# Patient Record
Sex: Female | Born: 1955 | Hispanic: Yes | Marital: Single | Smoking: Never smoker
Health system: Southern US, Community
[De-identification: ages and names within clinical notes are randomized; demographics above are authoritative.]

## PROBLEM LIST (undated history)

## (undated) DIAGNOSIS — E119 Type 2 diabetes mellitus without complications: Secondary | ICD-10-CM

## (undated) DIAGNOSIS — I10 Essential (primary) hypertension: Secondary | ICD-10-CM

---

## 2008-04-16 ENCOUNTER — Emergency Department: Payer: Self-pay | Admitting: Emergency Medicine

## 2012-11-23 ENCOUNTER — Ambulatory Visit: Payer: Self-pay | Admitting: Family Medicine

## 2013-09-29 NOTE — ED Provider Notes (Signed)
 [facility] Emergency Department Provider Note  ED Clinical Impression   Final diagnoses:  Anemia (Primary)  Sciatica, left    Initial Impression, ED Course, Assessment and Plan   Breanna Booker is a 58 y.o. female with 2-66m h/o rectal bleeding, presents with two complaints: 1. Continuation of rectal bleeding since hospitalization 2. Leg cramping/pain.  Regarding rectal bleeding, no obvious source of bleeding found during hospitalization, pt has upcoming capsule study scheduled outpt with GI. No new or unusual features today, has been ongoing 3-4 months without change. Will check CBC today to ensure her Hgb is stable from discharge.   Regarding leg pain, H+P most c/w sciatica. DVT in the differential in pt s/p hospitalization but there is no evidence of this on her physical exam. Will also check electrolyte panel for any abnormalities that may be contributing to cramping sensation (BMP, Mg, ionized calcium). In the meantime will give Motrin and Valium for pain control.   Sep 29, 2013 2:01 PM H/H stable from discharge, no electrolyte abnormalities. Pt states she feels much better, ambulating normally, leg cramping has resolved. Will DC home at this time for PCP follow-up.   ____________________________________________  Time seen: Sep 29, 2013 11:51 AM  I have reviewed the triage vital signs and the nursing notes.  I have discussed the case with the ED Attending, Dr. Patrcia.   History   Chief Complaint Rectal Bleeding   HPI  Breanna Booker is a 58 y.o. female with 2-69m h/o rectal bleeding, presents with two main complaints today.   1. Rectal bleeding:  Recently hospitalized for microcytic anemia, Hgb 7.7 transfused 2U. Endoscopy/colonoscopy neg for source of bleeding, pt scheduled for capsule study with GI as outpatient. Patient states that her rectal bleeding never resolved since last admission. She endorses brown stools and feeling dizzy. No abdominal pain, nausea, or  vomiting. No CP or SOB.   2. Leg pain/cramping: Patient also reports LLE muscle cramping and shooting pain from the left hip all the way to the left foot upon standing up, but not when lying down. No history of same. No swelling or redness. She denies taking any pain medication for this pain.  Patient and husband are concerned that this represents a reaction to one of her medications (has been taking Miralax and iron tablets as prescribed since discharge).    No past medical history on file.  Past Surgical History  Procedure Laterality Date  . Pr colonoscopy flx dx w/collj spec when pfrmd Left 09/24/2013    Procedure: COLONOSCOPY, FLEXIBLE, PROXIMAL TO SPLENIC FLEXURE; DIAGNOSTIC, W/WO COLLECTION SPECIMEN BY BRUSH OR WASH;  Surgeon: Dorn JINNY Lauth, MD;  [facility]: [facility];  Service: Gastroenterology  . Pr upper gi endoscopy,biopsy N/A 09/24/2013    Procedure: UGI ENDOSCOPY; WITH BIOPSY, SINGLE OR MULTIPLE;  Surgeon: Dorn JINNY Lauth, MD;  Location: [facility];  Service: Gastroenterology    Current Outpatient Rx  Name  Route  Sig  Dispense  Refill  . ferrous sulfate 325 (65 FE) MG tablet      Take 1 tablet daily for 1 week. If tolerated, increase to 1 tablet twice daily.   60 tablet   2   . hydrocortisone (ANUSOL-HC) 2.5 % rectal cream   Rectal   Insert into the rectum Two (2) times a day.         . polyethylene glycol (MIRALAX) 17 gram packet      Take 1 packet twice daily to prevent  constipation. Increase or decrease dose as needed to have 1 soft BM daily.   60 packet   2     Allergies Review of patient's allergies indicates no known allergies.  No family history on file.  Social History History  Substance Use Topics  . Smoking status: Never Smoker   . Smokeless tobacco: Not on file  . Alcohol Use: No    Review of Systems Constitutional: Negative for fever. Eyes: Negative for visual changes. ENT: Negative for sore  throat. Cardiovascular: Negative for chest pain. Respiratory: Negative for shortness of breath. Gastrointestinal: Negative for abdominal pain, vomiting or diarrhea. + rectal bleeding Genitourinary: Negative for dysuria. Musculoskeletal: Negative for back pain. + LLE muscle cramping Skin: Negative for rash. Neurological: Negative for headaches. + shooting pain in LLE.    Physical Exam   VITAL SIGNS:   ED Triage Vitals  Enc Vitals Group     BP 09/29/13 1133 128/68 mmHg     Heart Rate 09/29/13 1133 81     Resp 09/29/13 1133 18     Temp 09/29/13 1133 37 C (98.6 F)     Temp Source 09/29/13 1133 Skin     SpO2 09/29/13 1133 97 %    Constitutional: Alert and oriented. Well appearing and in no distress. Eyes: Conjunctivae are normal. ENT      Head: Normocephalic and atraumatic.      Nose: No congestion.      Mouth/Throat: Mucous membranes are moist.      Neck: No stridor. Hematological/Lymphatic/Immunilogical: No cervical lymphadenopathy.  Cardiovascular: Normal rate, regular rhythm. Normal and symmetric distal pulses are present in all extremities. Respiratory: Normal respiratory effort. Breath sounds are normal. Gastrointestinal: Guaiac positive. Soft and nontender. Normal bowel sounds. No rebound or guarding. There is no CVA tenderness. Rectal: Light brown stool, guiac positive Musculoskeletal: Mildly TTP over paraspinal muscles of L-spine on L and L gluteus. No midline spinal tenderness.  Neurologic: Normal speech and language. No gross focal neurologic deficits are appreciated. Skin: Skin is warm, dry and intact. No rash noted. Psychiatric: Mood and affect are normal. Speech and behavior are normal.   EKG   Not applicable  Radiology   Not applicable  Procedures   None    Pertinent labs & imaging results that were available during my care of the patient were reviewed by me and considered in my medical decision making (see chart for  details).  __________________________________________________________________ Documentation assistance was provided by Thad Reding, Scribe, on Sep 29, 2013 at 11:51 AM for Dr. Judeth.   A scribe was used when documenting this visit. I agree with the above documentation. Signed by  Lauraine Judeth on  Sep 29, 2013 at 12:03 PM       Lauraine Judeth, MD Resident 09/29/13 [phone]

## 2013-10-17 ENCOUNTER — Ambulatory Visit: Payer: Self-pay | Admitting: Surgery

## 2013-10-17 LAB — CBC WITH DIFFERENTIAL/PLATELET
Basophil #: 0 10*3/uL (ref 0.0–0.1)
Basophil %: 0.4 %
Eosinophil #: 0.1 10*3/uL (ref 0.0–0.7)
Eosinophil %: 1.9 %
HCT: 36.3 % (ref 35.0–47.0)
HGB: 11.8 g/dL — AB (ref 12.0–16.0)
LYMPHS PCT: 39.1 %
Lymphocyte #: 2.2 10*3/uL (ref 1.0–3.6)
MCH: 26.2 pg (ref 26.0–34.0)
MCHC: 32.6 g/dL (ref 32.0–36.0)
MCV: 81 fL (ref 80–100)
MONO ABS: 0.5 x10 3/mm (ref 0.2–0.9)
MONOS PCT: 9 %
Neutrophil #: 2.8 10*3/uL (ref 1.4–6.5)
Neutrophil %: 49.6 %
Platelet: 179 10*3/uL (ref 150–440)
RBC: 4.51 10*6/uL (ref 3.80–5.20)
RDW: 20.8 % — ABNORMAL HIGH (ref 11.5–14.5)
WBC: 5.6 10*3/uL (ref 3.6–11.0)

## 2013-10-17 LAB — BASIC METABOLIC PANEL
Anion Gap: 5 — ABNORMAL LOW (ref 7–16)
BUN: 16 mg/dL (ref 7–18)
CALCIUM: 9.2 mg/dL (ref 8.5–10.1)
CREATININE: 0.76 mg/dL (ref 0.60–1.30)
Chloride: 105 mmol/L (ref 98–107)
Co2: 29 mmol/L (ref 21–32)
EGFR (Non-African Amer.): >60
Glucose: 115 mg/dL — ABNORMAL HIGH (ref 65–99)
Osmolality: 280 (ref 275–301)
POTASSIUM: 4 mmol/L (ref 3.5–5.1)
Sodium: 139 mmol/L (ref 136–145)

## 2013-10-21 ENCOUNTER — Emergency Department: Payer: Self-pay | Admitting: Emergency Medicine

## 2013-10-21 LAB — CBC WITH DIFFERENTIAL/PLATELET
Basophil #: 0 10*3/uL (ref 0.0–0.1)
Basophil %: 0.4 %
EOS PCT: 1.8 %
Eosinophil #: 0.1 10*3/uL (ref 0.0–0.7)
HCT: 37.8 % (ref 35.0–47.0)
HGB: 12 g/dL (ref 12.0–16.0)
LYMPHS ABS: 2.4 10*3/uL (ref 1.0–3.6)
Lymphocyte %: 30.6 %
MCH: 25.5 pg — AB (ref 26.0–34.0)
MCHC: 31.8 g/dL — AB (ref 32.0–36.0)
MCV: 80 fL (ref 80–100)
Monocyte #: 0.6 x10 3/mm (ref 0.2–0.9)
Monocyte %: 7.3 %
NEUTROS ABS: 4.6 10*3/uL (ref 1.4–6.5)
Neutrophil %: 59.9 %
Platelet: 201 10*3/uL (ref 150–440)
RBC: 4.72 10*6/uL (ref 3.80–5.20)
RDW: 20.9 % — AB (ref 11.5–14.5)
WBC: 7.7 10*3/uL (ref 3.6–11.0)

## 2013-10-21 LAB — URINALYSIS, COMPLETE
BACTERIA: NONE SEEN
Bilirubin,UR: NEGATIVE
GLUCOSE, UR: NEGATIVE mg/dL (ref 0–75)
KETONE: NEGATIVE
Leukocyte Esterase: NEGATIVE
NITRITE: NEGATIVE
Ph: 5 (ref 4.5–8.0)
Protein: NEGATIVE
RBC, UR: NONE SEEN /HPF (ref 0–5)
SPECIFIC GRAVITY: 1.013 (ref 1.003–1.030)
Squamous Epithelial: 1

## 2013-10-21 LAB — COMPREHENSIVE METABOLIC PANEL
ALK PHOS: 96 U/L
AST: 38 U/L — AB (ref 15–37)
Albumin: 3.6 g/dL (ref 3.4–5.0)
Anion Gap: 5 — ABNORMAL LOW (ref 7–16)
BILIRUBIN TOTAL: 0.5 mg/dL (ref 0.2–1.0)
BUN: 19 mg/dL — ABNORMAL HIGH (ref 7–18)
Calcium, Total: 9.5 mg/dL (ref 8.5–10.1)
Chloride: 103 mmol/L (ref 98–107)
Co2: 30 mmol/L (ref 21–32)
Creatinine: 0.82 mg/dL (ref 0.60–1.30)
EGFR (African American): >60
EGFR (Non-African Amer.): >60
Glucose: 85 mg/dL (ref 65–99)
Osmolality: 277 (ref 275–301)
POTASSIUM: 3.6 mmol/L (ref 3.5–5.1)
SGPT (ALT): 47 U/L (ref 12–78)
Sodium: 138 mmol/L (ref 136–145)
TOTAL PROTEIN: 8.3 g/dL — AB (ref 6.4–8.2)

## 2013-10-22 LAB — PATHOLOGY REPORT

## 2013-12-07 ENCOUNTER — Emergency Department: Payer: Self-pay | Admitting: Internal Medicine

## 2014-08-15 ENCOUNTER — Emergency Department: Admit: 2014-08-15 | Disposition: A | Discharge status: Home / Self Care | Payer: Self-pay | Admitting: Internal Medicine

## 2014-08-15 LAB — BASIC METABOLIC PANEL
Anion Gap: 5 — ABNORMAL LOW (ref 7–16)
BUN: 20 mg/dL
CALCIUM: 9.1 mg/dL
CO2: 31 mmol/L
Chloride: 104 mmol/L
Creatinine: 0.72 mg/dL
EGFR (African American): >60
EGFR (Non-African Amer.): >60
Glucose: 188 mg/dL — ABNORMAL HIGH
Potassium: 3.6 mmol/L
SODIUM: 140 mmol/L

## 2014-08-15 LAB — CBC
HCT: 41.4 % (ref 35.0–47.0)
HGB: 13.5 g/dL (ref 12.0–16.0)
MCH: 27.4 pg (ref 26.0–34.0)
MCHC: 32.5 g/dL (ref 32.0–36.0)
MCV: 84 fL (ref 80–100)
Platelet: 171 10*3/uL (ref 150–440)
RBC: 4.91 10*6/uL (ref 3.80–5.20)
RDW: 13.6 % (ref 11.5–14.5)
WBC: 6.5 10*3/uL (ref 3.6–11.0)

## 2014-08-15 LAB — HEPATIC FUNCTION PANEL A (ARMC)
AST: 30 U/L
Albumin: 4 g/dL
Alkaline Phosphatase: 86 U/L
Bilirubin, Direct: <0.1 mg/dL
Bilirubin,Total: 0.6 mg/dL
SGPT (ALT): 36 U/L
TOTAL PROTEIN: 7.6 g/dL

## 2014-08-15 LAB — LIPASE, BLOOD: LIPASE: 38 U/L

## 2014-08-15 LAB — TROPONIN I: Troponin-I: <0.03 ng/mL

## 2014-08-15 LAB — D-DIMER(ARMC): D-DIMER: 321 ng/mL

## 2014-09-05 ENCOUNTER — Other Ambulatory Visit: Payer: Self-pay | Admitting: Family Medicine

## 2014-09-07 NOTE — Op Note (Signed)
PATIENT NAME:  Breanna Booker, Breanna Booker MR#:  [id number] DATE OF BIRTH:  05-25-1955  DATE OF PROCEDURE:  10/17/2013  ATTENDING SURGEON: Cristal Deerhristopher A. Jacquez Sheetz, MD  PREOPERATIVE DIAGNOSIS: Bleeding internal hemorrhoids.   POSTOPERATIVE Bleeding mixed internal/external hemorrhoids.   PROCEDURE PERFORMED: Closed excisional 3-column hemorrhoidectomy.   ESTIMATED BLOOD LOSS: 15 mL.   COMPLICATIONS: None.   SPECIMEN: Hemorrhoids.   ANESTHESIA: General.   INDICATION FOR SURGERY: [name] is a pleasant 59 year old female who had been admitted on multiple occasions and transfused for bleeding hemorrhoids. She thus was brought to the operating room suite for management of hemorrhoids.   DETAILS OF PROCEDURE: As follows: Informed consent was obtained. [name] was brought to the operating room suite. She was induced. Endotracheal tube was placed, general anesthesia was administered. She was then laid prone on the operating room table. Her anus was prepped and draped in standard surgical fashion. A timeout was then performed, correctly identifying the patient name, operative site and procedure to be performed. Approximately 40 mL of 1% lidocaine with epinephrine was used to anesthetize her anal subcutaneous tissue as well as provide anesthesia to her sphincteric muscles. I then placed a well-lubed finger into her anus and did not feel any obvious masses. I then used a combination of Hill-Ferguson retractor and anoscope to perform 3-column hemorrhoidectomy. The base of the hemorrhoid was ligated with a 3-0 chromic, and then the hemorrhoid was excised with a Bovie electrocautery, and then the mucosa edges were sewn together with running chromic. Small pieces of the muscle were taken with the closure as well as sutures were locked. After all 3 columns were excised, I reexamined the wound. There was no obvious stenosis. Everything appeared to be hemostatic. I then placed Gelfoam into the  patient's anus and placed a sterile dressing. She was then awoken, extubated and brought to the postanesthesia care unit. There were no immediate complications. Needle, sponge and instrument counts were correct at the end of the procedure.    ____________________________ Si Raiderhristopher A. Samanthamarie Ezzell, MD cal:lb D: 10/18/2013 10:30:38 ET T: 10/18/2013 10:43:06 ET JOB#: [id number]  cc: Cristal Deerhristopher A. Ardean Melroy, MD, <Dictator> Jarvis NewcomerHRISTOPHER A Monie Shere MD ELECTRONICALLY SIGNED 10/18/2013 18:06

## 2014-09-11 ENCOUNTER — Ambulatory Visit
Admit: 2014-09-11 | Disposition: A | Discharge status: Home / Self Care | Payer: Self-pay | Attending: Family Medicine | Admitting: Family Medicine

## 2018-04-17 ENCOUNTER — Ambulatory Visit: Payer: Self-pay

## 2018-07-10 ENCOUNTER — Ambulatory Visit: Payer: Self-pay

## 2018-10-30 ENCOUNTER — Ambulatory Visit: Payer: Self-pay

## 2018-12-19 ENCOUNTER — Other Ambulatory Visit: Payer: Self-pay

## 2018-12-20 ENCOUNTER — Ambulatory Visit
Admission: RE | Admit: 2018-12-20 | Discharge: 2018-12-20 | Disposition: A | Discharge status: Home / Self Care | Payer: Self-pay | Source: Ambulatory Visit | Attending: Oncology | Admitting: Oncology

## 2018-12-20 ENCOUNTER — Encounter (INDEPENDENT_AMBULATORY_CARE_PROVIDER_SITE_OTHER): Payer: Self-pay

## 2018-12-20 ENCOUNTER — Other Ambulatory Visit: Payer: Self-pay

## 2018-12-20 ENCOUNTER — Encounter: Payer: Self-pay | Admitting: *Deleted

## 2018-12-20 ENCOUNTER — Ambulatory Visit: Payer: Self-pay | Attending: Oncology | Admitting: *Deleted

## 2018-12-20 VITALS — BP 146/90 | HR 81 | Temp 96.8°F | Ht 62.0 in | Wt 140.0 lb

## 2018-12-20 DIAGNOSIS — Z0000 Encounter for general adult medical examination without abnormal findings: Secondary | ICD-10-CM

## 2018-12-20 NOTE — Progress Notes (Addendum)
  Subjective:     Patient ID: Breanna Booker, female   DOB: 04/03/1956, 63 y.o.   MRN: [id number]  HPI   Review of Systems     Objective:   Physical Exam Chest:     Breasts:        Right: Inverted nipple present. No swelling, bleeding, mass, nipple discharge, skin change or tenderness.        Left: No swelling, bleeding, inverted nipple, mass, nipple discharge, skin change or tenderness.    Lymphadenopathy:     Upper Body:     Right upper body: No supraclavicular or axillary adenopathy.     Left upper body: No supraclavicular or axillary adenopathy.        Assessment:     63 year old Hispanic female presents to [facility] for clinical breast exam and mammogram.  Last pap on 09/01/15 was negative / negative.  Breanna Booker, the interpreter present during the interview and exam.  Clinical breast exam unremarkable.  Taught self breast awareness.  Patient has been screened for eligibility.  She does not have any insurance, Medicare or Medicaid.  She also meets financial eligibility.  Hand-out given on the Affordable Care Act.  Risk Assessment    Risk Scores      12/20/2018   Last edited by: Orson Slick, CMA   5-year risk: 0.7 %   Lifetime risk: 3.7 %            Plan:      Screening mammogram ordered.  Will follow up per [facility] protocol.

## 2018-12-28 ENCOUNTER — Encounter: Payer: Self-pay | Admitting: *Deleted

## 2018-12-28 NOTE — Progress Notes (Signed)
Letter mailed from the [facility] to inform patient of her normal mammogram results.  Patient is to follow-up with annual screening in one year.  HSIS to Christy. 

## 2020-06-25 ENCOUNTER — Emergency Department
Admission: EM | Admit: 2020-06-25 | Discharge: 2020-06-25 | Disposition: A | Discharge status: Home / Self Care | Payer: Self-pay | Attending: Emergency Medicine | Admitting: Emergency Medicine

## 2020-06-25 ENCOUNTER — Emergency Department: Payer: Self-pay

## 2020-06-25 ENCOUNTER — Other Ambulatory Visit: Payer: Self-pay

## 2020-06-25 ENCOUNTER — Encounter: Payer: Self-pay | Admitting: Emergency Medicine

## 2020-06-25 DIAGNOSIS — M25511 Pain in right shoulder: Secondary | ICD-10-CM | POA: Insufficient documentation

## 2020-06-25 DIAGNOSIS — M7080 Other soft tissue disorders related to use, overuse and pressure of unspecified site: Secondary | ICD-10-CM

## 2020-06-25 DIAGNOSIS — X503XXA Overexertion from repetitive movements, initial encounter: Secondary | ICD-10-CM | POA: Insufficient documentation

## 2020-06-25 DIAGNOSIS — Y990 Civilian activity done for income or pay: Secondary | ICD-10-CM | POA: Insufficient documentation

## 2020-06-25 DIAGNOSIS — M25519 Pain in unspecified shoulder: Secondary | ICD-10-CM

## 2020-06-25 MED ORDER — LIDOCAINE 5 % EX PTCH: 1.0000 | MEDICATED_PATCH | Freq: Two times a day (BID) | CUTANEOUS | 0 refills | Status: AC

## 2020-06-25 MED ORDER — LIDOCAINE 5 % EX PTCH
1.0000 | MEDICATED_PATCH | CUTANEOUS | Status: DC
  Administered 2020-06-25: 1 via TRANSDERMAL
  Filled 2020-06-25: qty 1

## 2020-06-25 NOTE — ED Notes (Signed)
Patient transported to X-ray 

## 2020-06-25 NOTE — Discharge Instructions (Addendum)
No acute findings on x-ray of the right shoulder.  Wear arm sling for 3 days and continue previous medications.  Use Lidoderm patches as directed.  Advised to contact you job if you wish to have this listed as a work-related injury.  Advise physical therapy for the right shoulder to relieve your pain.

## 2020-06-25 NOTE — ED Provider Notes (Signed)
[facility] Emergency Department Provider Note   ____________________________________________   Event Date/Time   First MD Initiated Contact with Patient 06/25/20 1423     (approximate)  I have reviewed the triage vital signs and the nursing notes.   HISTORY  Chief Complaint Shoulder Pain    HPI: Via interpreter Breanna Booker is a 65 y.o. female patient presents with 40 days of right shoulder pain.  Pain has increased in the past 10 days.  Patient is a work-related injury.  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  No other provocative incident for complaint.  Patient has not reported this is a work-related injury.  Patient states she sees 3 doctors in the past month and needs given her different medication.  States no relief with either medications.  Points to the posterior superior aspect of the right shoulder as the main source of pain.  Rates pain as a 9/10.  Described the pain as "achy".  Current medicines consist of gabapentin 300 mg 3 times daily, oxycodone 5-3 25 every 6 hours, and meloxicam 7.5 mg twice daily.         History reviewed. No pertinent past medical history.  There are no problems to display for this patient.   History reviewed. No pertinent surgical history.  Prior to Admission medications   Medication Sig Start Date End Date Taking? Authorizing Provider  lidocaine (LIDODERM) 5 % Place 1 patch onto the skin every 12 (twelve) hours. Remove & Discard patch within 12 hours or as directed by MD 06/25/20 06/25/21 Yes Joni Reining, PA-C    Allergies Patient has no known allergies.  Family History  Problem Relation Age of Onset  . Breast cancer Neg Hx     Social History Social History   Tobacco Use  . Smoking status: Never Smoker  . Smokeless tobacco: Never Used  Substance Use Topics  . Alcohol use: Not Currently  . Drug use: Not Currently    Review of Systems Constitutional: No fever/chills Eyes: No  visual changes. ENT: No sore throat. Cardiovascular: Denies chest pain. Respiratory: Denies shortness of breath. Gastrointestinal: No abdominal pain.  No nausea, no vomiting.  No diarrhea.  No constipation. Genitourinary: Negative for dysuria. Musculoskeletal: Right shoulder pain. Skin: Negative for rash. Neurological: Negative for headaches, focal weakness or numbness.   ____________________________________________   PHYSICAL EXAM:  VITAL SIGNS: ED Triage Vitals  Enc Vitals Group     BP 06/25/20 1413 (!) 164/99     Pulse Rate 06/25/20 1413 84     Resp 06/25/20 1413 18     Temp 06/25/20 1413 98.3 F (36.8 C)     Temp Source 06/25/20 1413 Oral     SpO2 06/25/20 1413 95 %     Weight 06/25/20 1410 140 lb (63.5 kg)     Height 06/25/20 1410 5\' 2"  (1.575 m)     Head Circumference --      Peak Flow --      Pain Score 06/25/20 1409 9     Pain Loc --      Pain Edu? --      Excl. in GC? --    Constitutional: Alert and oriented. Well appearing and in no acute distress. Eyes: Conjunctivae are normal. PERRL. EOMI. Head: Atraumatic. Nose: No congestion/rhinnorhea. Mouth/Throat: Mucous membranes are moist.  Oropharynx non-erythematous. Neck: No stridor.  Hematological/Lymphatic/Immunilogical: No cervical lymphadenopathy. Cardiovascular: Normal rate, regular rhythm. Grossly normal heart sounds.  Good peripheral circulation.  Elevated blood  pressure. Respiratory: Normal respiratory effort.  No retractions. Lungs CTAB. Musculoskeletal: No obvious deformity to the right shoulder.  Has full and equal range of motion while talking.  Decreased range of motion when attention is applied to the upper extremity.  Strength is 3/5 in comparison to the left nonaffected upper extremity. Neurologic:  Normal speech and language. No gross focal neurologic deficits are appreciated. No gait instability. Skin:  Skin is warm, dry and intact. No rash noted. Psychiatric: Mood and affect are normal. Speech and  behavior are normal.  ____________________________________________   LABS (all labs ordered are listed, but only abnormal results are displayed)  Labs Reviewed - No data to display ____________________________________________  EKG   ____________________________________________  RADIOLOGY I, Joni Reining, personally viewed and evaluated these images (plain radiographs) as part of my medical decision making, as well as reviewing the written report by the radiologist.  ED MD interpretation: No acute findings x-ray of the right shoulder.  Official radiology report(s): DG Shoulder Right  Result Date: 06/25/2020 CLINICAL DATA:  Right shoulder pain for 10 days EXAM: RIGHT SHOULDER - 2+ VIEW COMPARISON:  None. FINDINGS: Internal rotation, external rotation, and transscapular views demonstrate no fracture, subluxation, or dislocation. Joint spaces are well preserved. Right chest is clear. IMPRESSION: 1. Unremarkable right shoulder. Electronically Signed   By: Sharlet Salina M.D.   On: 06/25/2020 15:02    ____________________________________________   PROCEDURES  Procedure(s) performed (including Critical Care):  Procedures   ____________________________________________   INITIAL IMPRESSION / ASSESSMENT AND PLAN / ED COURSE  As part of my medical decision making, I reviewed the following data within the electronic MEDICAL RECORD NUMBER         Patient presents with greater than 1 month of right shoulder pain secondary to repetitive motion.  Discussed x-ray  with patient revealing no acute findings.  Patient complaint and physical exam consistent with repetitive motion injury.  Patient placed in a sling.  Patient given a prescription for Lidoderm patches.  Patient advised continue previous medication.  Patient advised to contact her employer to see if she is eligible for Worker's Compensation to have physical therapy.      ____________________________________________   FINAL  CLINICAL IMPRESSION(S) / ED DIAGNOSES  Final diagnoses:  Shoulder pain  Repetitive motion injury     ED Discharge Orders         Ordered    lidocaine (LIDODERM) 5 %  Every 12 hours        06/25/20 1520          *Please note:  Breanna Booker was evaluated in Emergency Department on 06/25/2020 for the symptoms described in the history of present illness. She was evaluated in the context of the global COVID-19 pandemic, which necessitated consideration that the patient might be at risk for infection with the SARS-CoV-2 virus that causes COVID-19. Institutional protocols and algorithms that pertain to the evaluation of patients at risk for COVID-19 are in a state of rapid change based on information released by regulatory bodies including the CDC and federal and state organizations. These policies and algorithms were followed during the patient's care in the ED.  Some ED evaluations and interventions may be delayed as a result of limited staffing during and the pandemic.*   Note:  This document was prepared using Dragon voice recognition software and may include unintentional dictation errors.    Joni Reining, PA-C 06/25/20 1525    Dionne Bucy, MD 06/25/20 [phone]

## 2020-06-25 NOTE — ED Triage Notes (Signed)
Pt comes into the ED via POV c/o right shoulder pain that has been ongoing for 10 days.  Pt states she hurt it at work.  Pt explains she has been taking OTC medication with no pain relief.  Pt ambulatory to triage and in NAD.

## 2020-06-25 NOTE — ED Notes (Signed)
Pt calm, collective, denies pain or sob  

## 2020-06-25 NOTE — ED Notes (Addendum)
Pt stating that she does want this visit to be filed under workers compensation. Pt has documentation for working at [employer] No WC profile in the system for this corporation. Unable to complete WC. RN and provider notified.

## 2021-05-09 IMAGING — MG DIGITAL SCREENING BILATERAL MAMMOGRAM WITH TOMO AND CAD
8 series · 8 of 24 positions shown · non-contrast
Comparison: Previous exam(s).

CLINICAL DATA: Screening.

EXAM:
DIGITAL SCREENING BILATERAL MAMMOGRAM WITH TOMO AND CAD

[R MLO synth-2D]
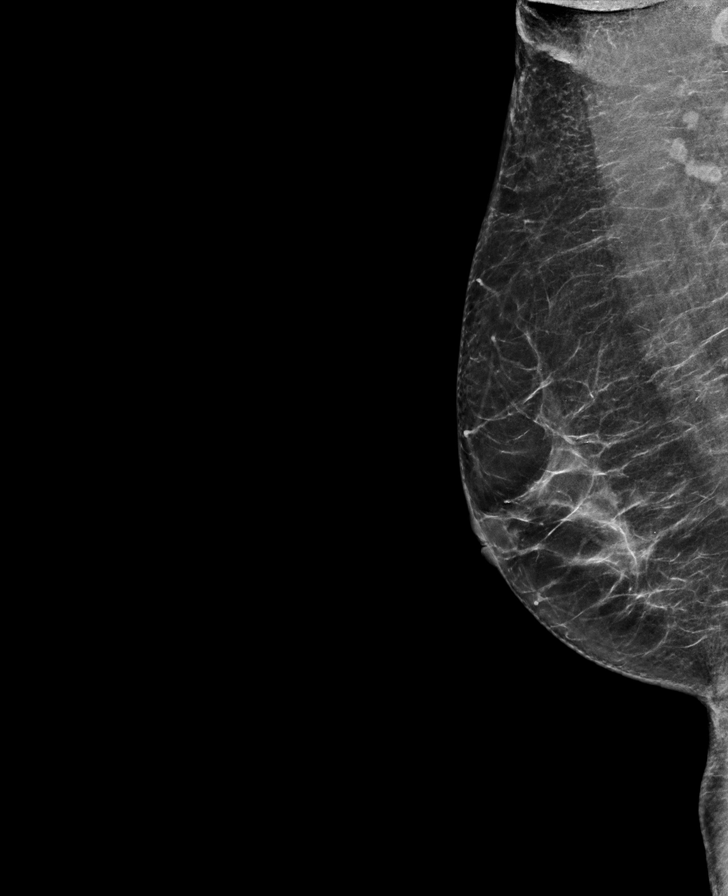

[R CC synth-2D]
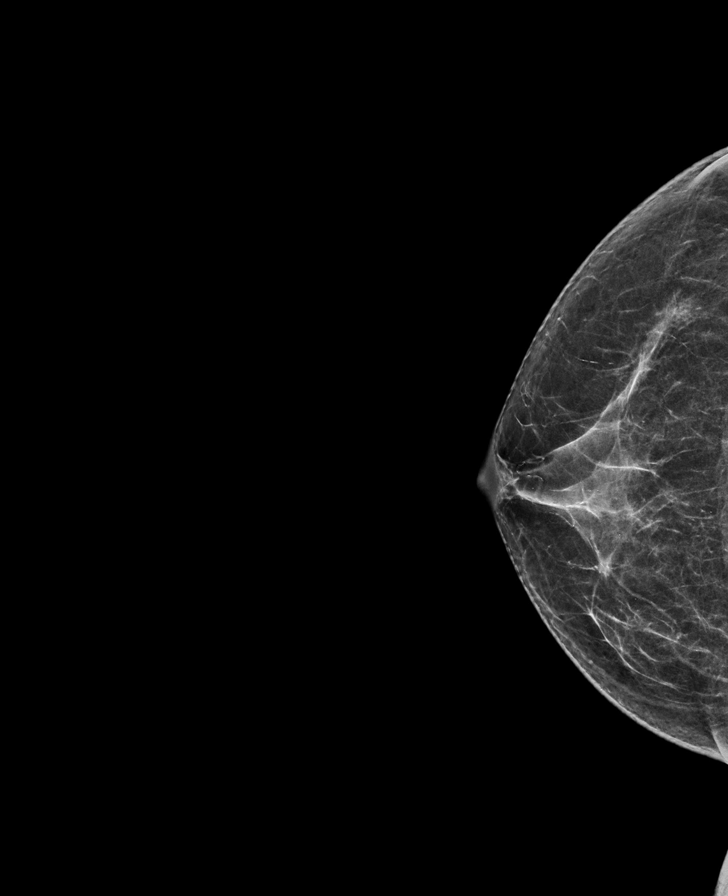

[L CC synth-2D]
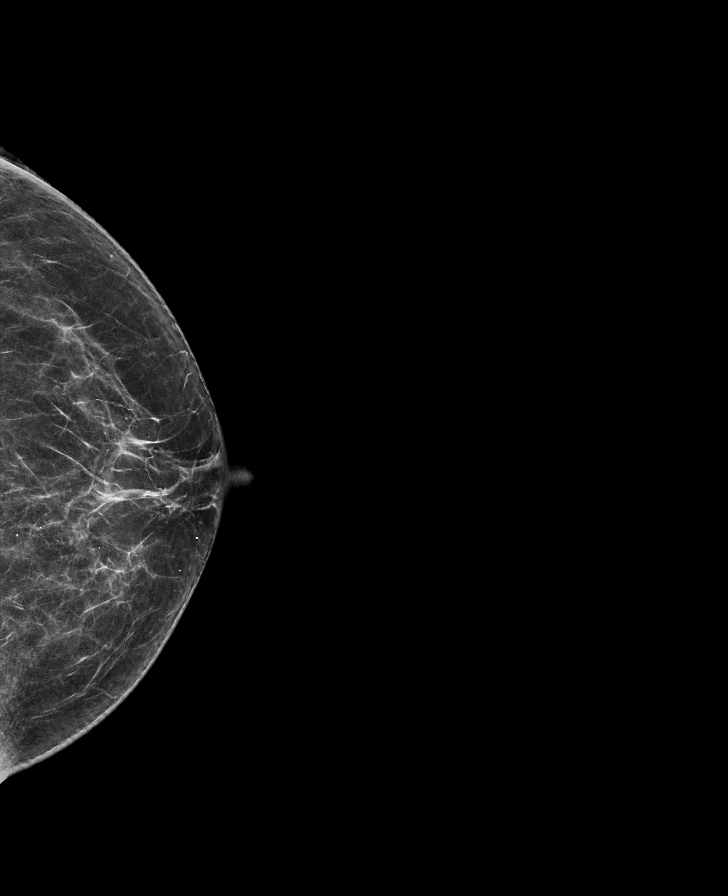

[L MLO synth-2D]
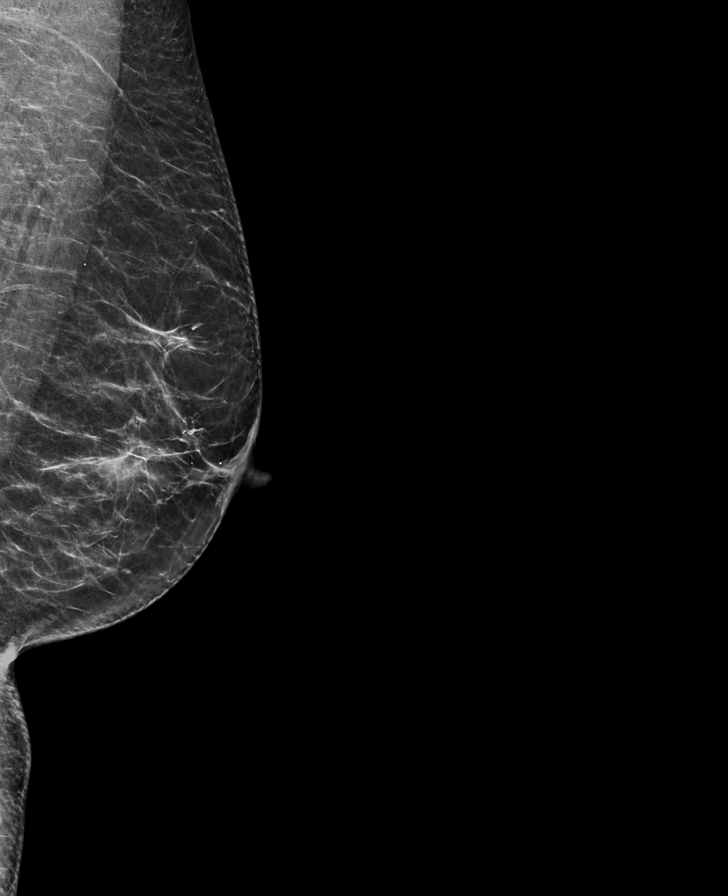

[R MLO tomo · tomo slice 34/67.0]
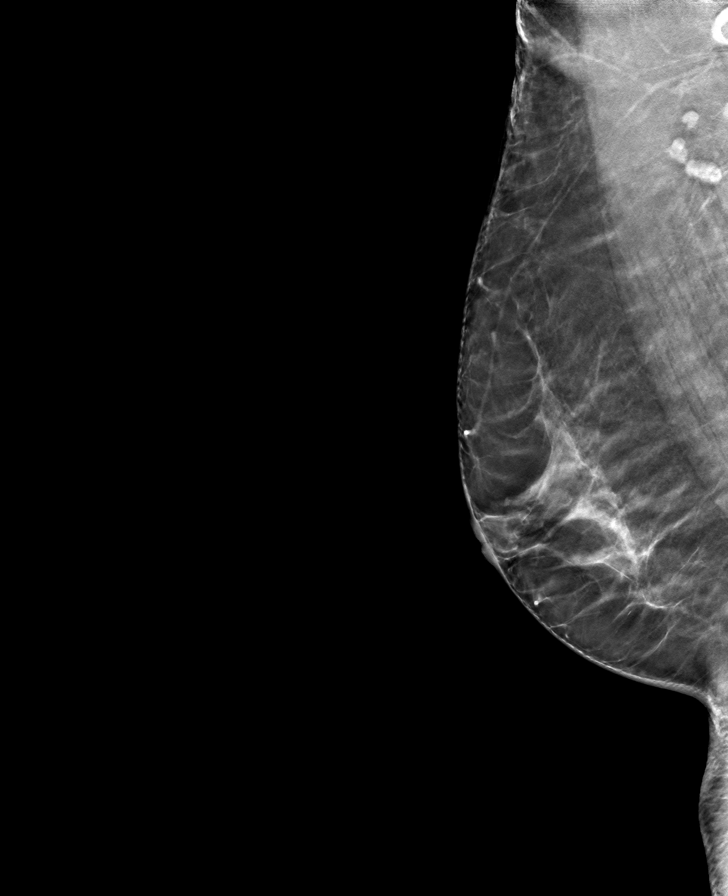

[L CC tomo · tomo slice 33/64.0]
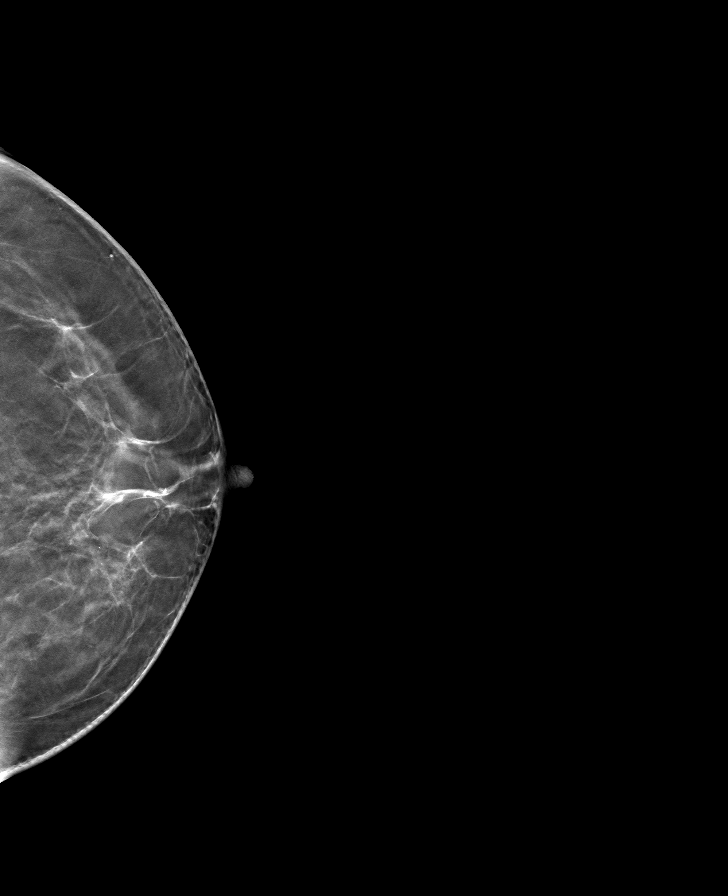

[L MLO tomo · tomo slice 32/63.0]
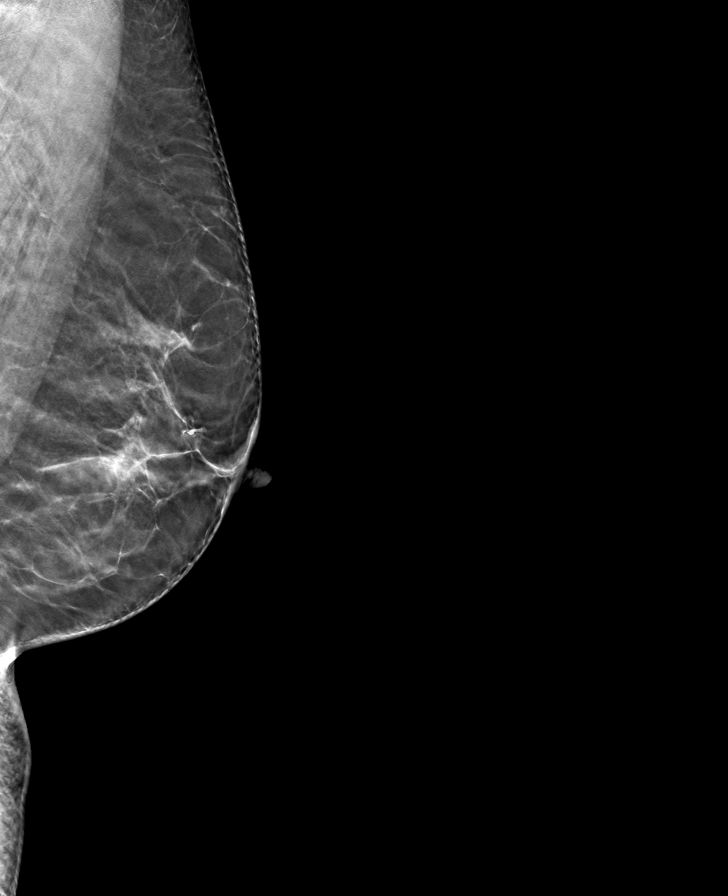

[R CC tomo · tomo slice 31/62.0]
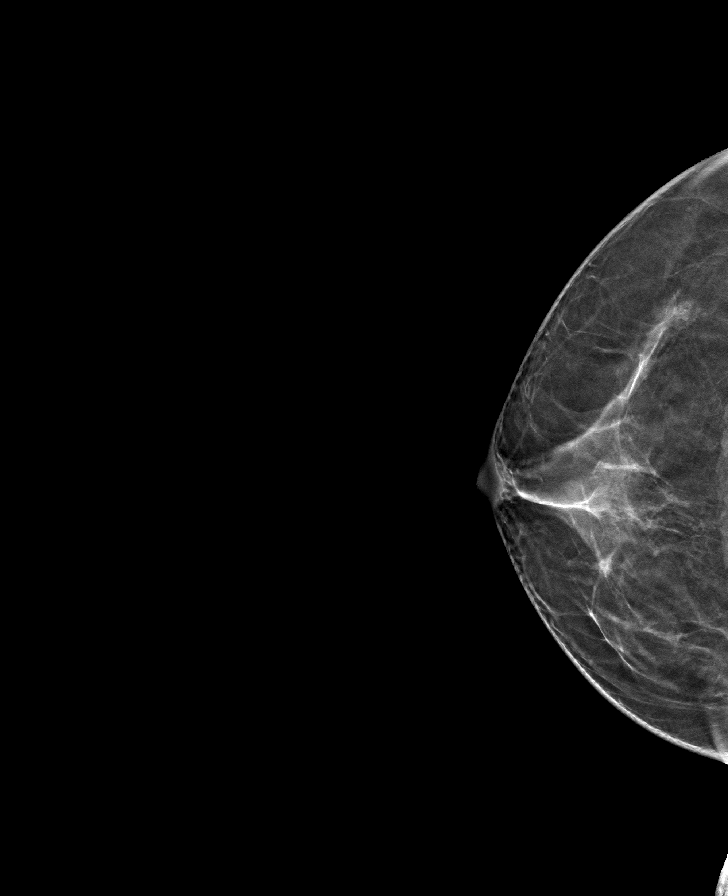

[8 of 24 positions shown; findings below may reference images not displayed]

ACR Breast Density Category b: There are scattered areas of
fibroglandular density.
FINDINGS: There are no findings suspicious for malignancy. Images were
processed with CAD.
IMPRESSION: No mammographic evidence of malignancy. A result letter of this
screening mammogram will be mailed directly to the patient.

RECOMMENDATION:
Screening mammogram in one year. (Code:CN-U-775)

BI-RADS CATEGORY  1: Negative.

## 2022-11-13 IMAGING — CR DG SHOULDER 2+V*R*
1 series · 3 of 3 positions shown · non-contrast
Comparison: None.

CLINICAL DATA: Right shoulder pain for 10 days

EXAM:
RIGHT SHOULDER - 2+ VIEW

[Series 1: dg shoulder right · 0.14mm/px · 3 of 3 slices shown]
[im 1/3]
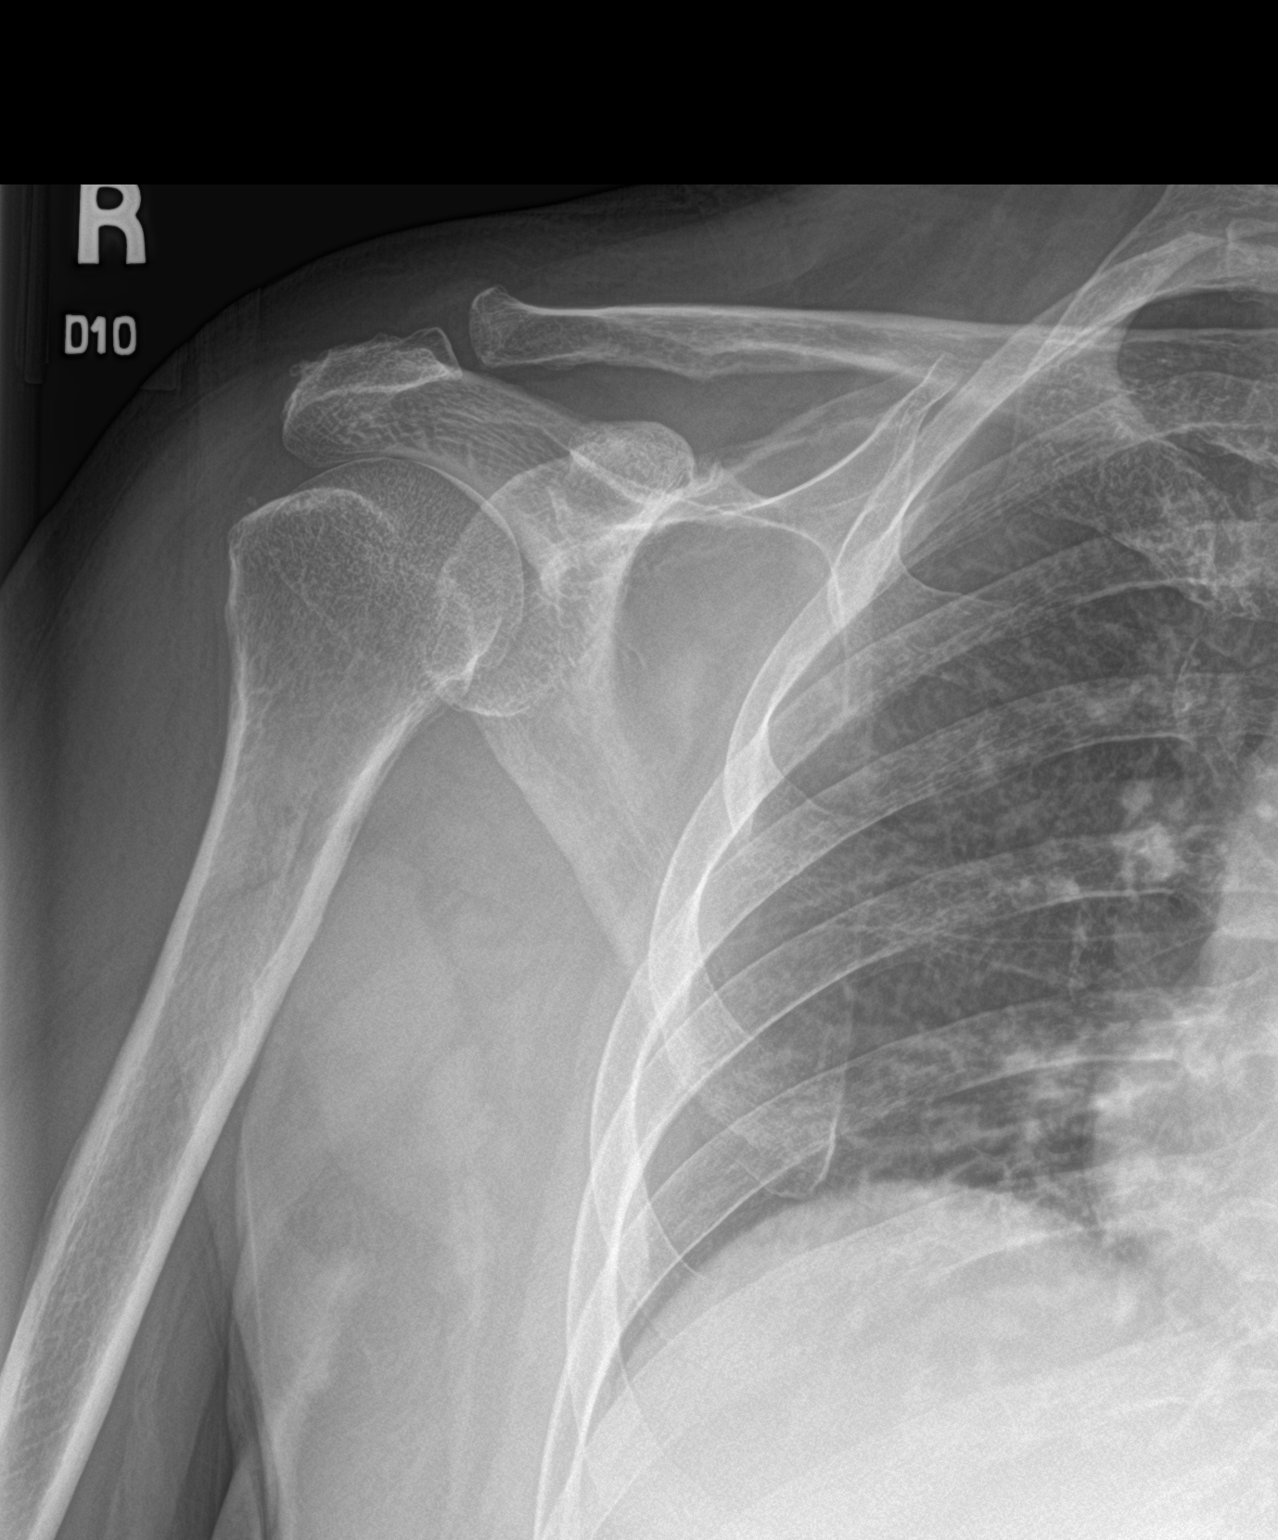
[im 2/3]
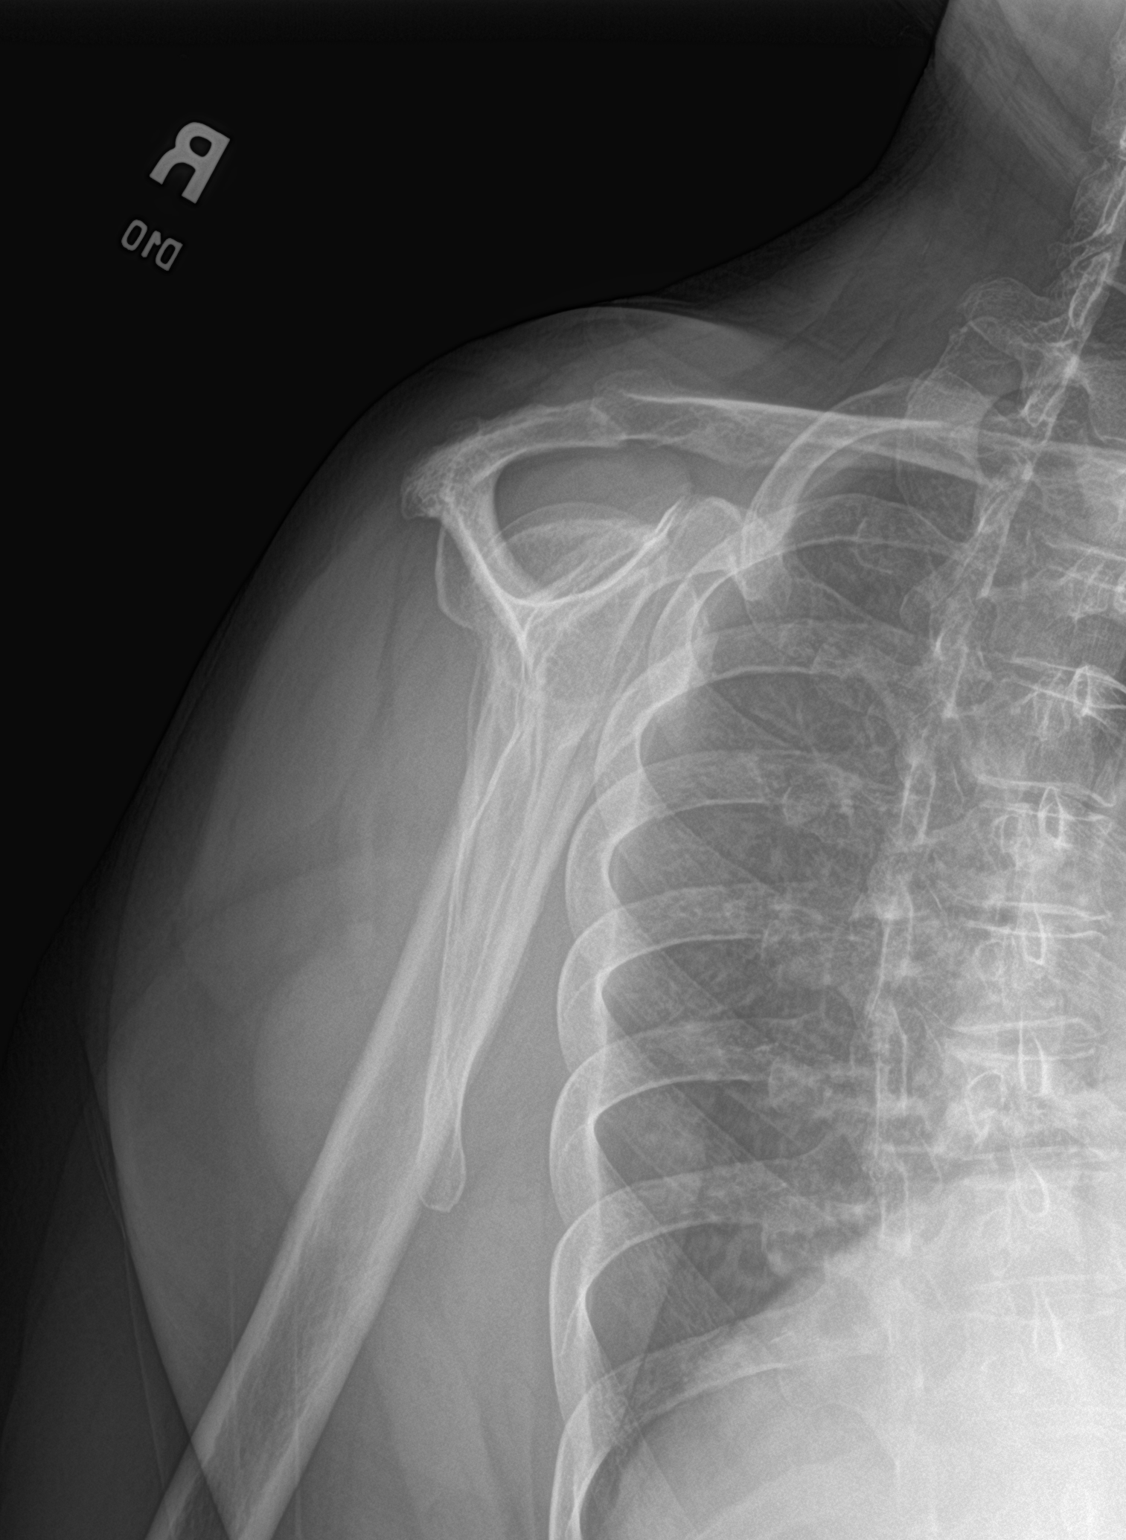
[im 3/3]
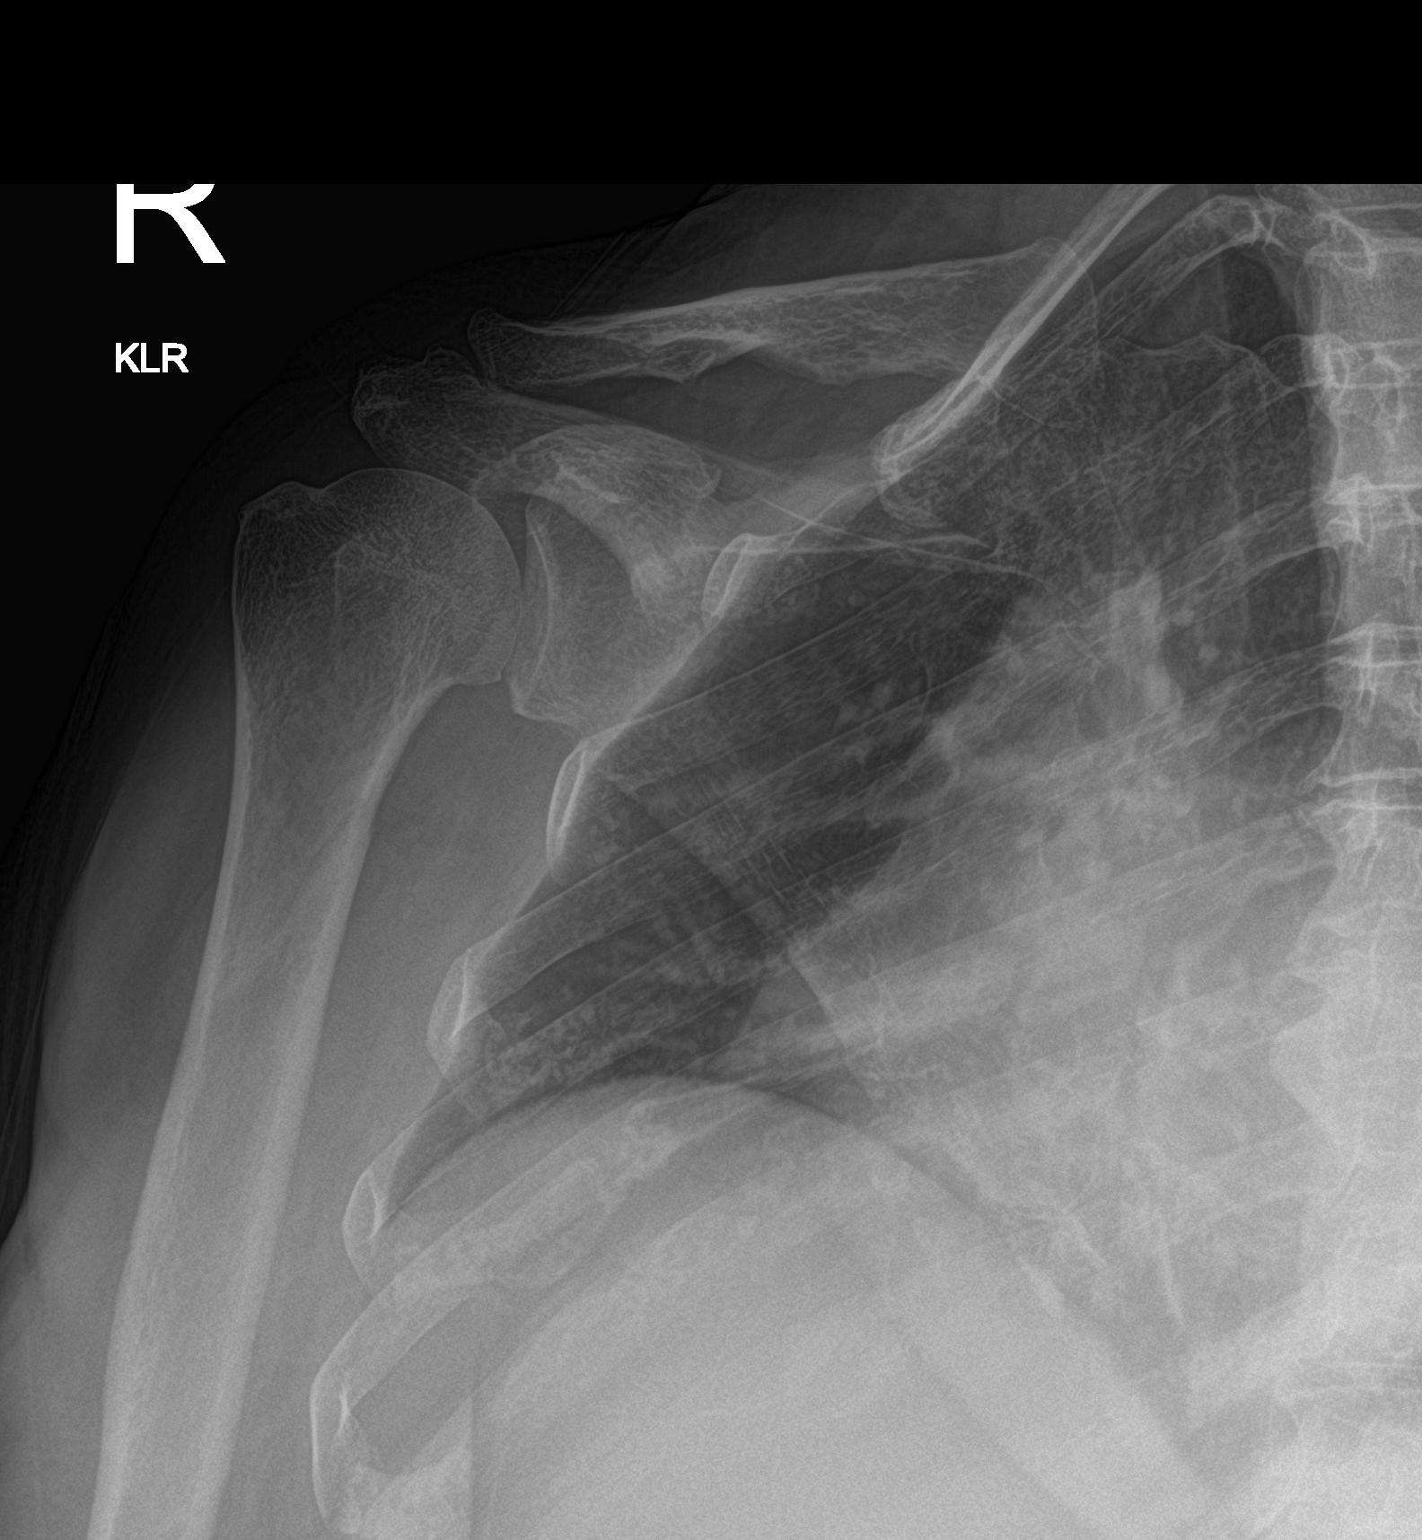

[3 of 3 positions shown; findings below may reference images not displayed]

FINDINGS: Internal rotation, external rotation, and transscapular views
demonstrate no fracture, subluxation, or dislocation. Joint spaces
are well preserved. Right chest is clear.
IMPRESSION: 1. Unremarkable right shoulder.

## 2023-11-08 NOTE — ED Triage Notes (Signed)
 Pt here with loss of vision in L eye. Pain in eye and photophobia.

## 2023-11-08 NOTE — ED Provider Notes (Signed)
 Emergency Department Provider Note    ED Clinical Impression   Final diagnoses:  Vision loss of left eye (Primary)    ED Assessment/Plan   The visual field loss patient describes sounds consistent with a retinal detachment.  Consider alternative diagnoses including temporal arteritis.  Will check laboratory studies and discussed with ophthalmology who will come see patient   History   Chief Complaint  Patient presents with  . Loss of Vision   HPI  Patient presents with vision loss of her left eye.  She says on May 11 she started to notice lateral left visual field loss.  It has gradually worsened and then last night she lost complete vision of the eye.  Has some pain associated with it and some headache associated on left side of her head.  Past Medical History[1]  Past Surgical History[2]  Family History[3]  Social History[4]  Review of Systems  See HPI.  12 point review of systems otherwise negative  Physical Exam   BP 149/88   Pulse 114   Temp 37.4 C (99.3 F) (Oral)   Resp 18   Wt 59.6 kg (131 lb 6.3 oz)   SpO2 96%   Physical Exam Vitals reviewed.  Constitutional:      General: She is not in acute distress.    Appearance: She is well-developed.  HENT:     Head: Normocephalic.   Eyes:     General: No scleral icterus.    Extraocular Movements: Extraocular movements intact.     Pupils: Pupils are equal, round, and reactive to light.     Comments: No vision from left side.  Both eye pressures are 19.   Cardiovascular:     Rate and Rhythm: Normal rate and regular rhythm.     Heart sounds: Normal heart sounds. No murmur heard.    No friction rub. No gallop.  Pulmonary:     Effort: Pulmonary effort is normal. No respiratory distress.     Breath sounds: Normal breath sounds.  Abdominal:     Palpations: Abdomen is soft.     Tenderness: There is no abdominal tenderness.   Musculoskeletal:        General: Normal range of motion.     Cervical back:  Normal range of motion and neck supple.   Skin:    General: Skin is warm and dry.   Neurological:     General: No focal deficit present.     Mental Status: She is alert.   Psychiatric:        Behavior: Behavior normal.     ED Course   ED Course as of 11/08/23 1748  Tue Nov 08, 2023  1621 Sed rate is only mildly elevated at 39.  The remainder the patient's labs are essentially unremarkable  1707 Ophthalmology is seeing patient and ordering IV Diamox for increased ocular pressure.  [facility] has seen patient and will follow-up with them this week.  I have sent prescriptions to the outpatient pharmacy so patient can pick them up this morning.  Discussed this with her family.     Medical Decision Making                [1] No past medical history on file. [2] Past Surgical History: Procedure Laterality Date  . PR COLONOSCOPY FLX DX W/COLLJ SPEC WHEN PFRMD Left 09/24/2013   Procedure: COLONOSCOPY, FLEXIBLE, PROXIMAL TO SPLENIC FLEXURE; DIAGNOSTIC, W/WO COLLECTION SPECIMEN BY BRUSH OR WASH;  Surgeon: Dorn JINNY Lauth, MD;  Location:  [facility];  Service: Gastroenterology  . PR UPPER GI ENDOSCOPY,BIOPSY N/A 09/24/2013   Procedure: UGI ENDOSCOPY; WITH BIOPSY, SINGLE OR MULTIPLE;  Surgeon: Dorn JINNY Lauth, MD;  Location: [facility];  Service: Gastroenterology  [3] History reviewed. No pertinent family history. [4] Social History Socioeconomic History  . Marital status: Married    Spouse name: None  . Number of children: None  . Years of education: None  . Highest education level: None  Tobacco Use  . Smoking status: Never  Substance and Sexual Activity  . Alcohol use: No  . Drug use: No   Kriss Morene Lung, FNP 11/08/23 [phone]

## 2023-12-18 ENCOUNTER — Other Ambulatory Visit: Payer: Self-pay

## 2023-12-18 ENCOUNTER — Emergency Department
Admission: EM | Admit: 2023-12-18 | Discharge: 2023-12-18 | Disposition: A | Discharge status: Home / Self Care | Payer: Self-pay

## 2023-12-18 DIAGNOSIS — R110 Nausea: Secondary | ICD-10-CM | POA: Insufficient documentation

## 2023-12-18 DIAGNOSIS — R300 Dysuria: Secondary | ICD-10-CM | POA: Insufficient documentation

## 2023-12-18 LAB — CBC
HCT: 43 % (ref 36.0–46.0)
Hemoglobin: 14.3 g/dL (ref 12.0–15.0)
MCH: 27.1 pg (ref 26.0–34.0)
MCHC: 33.3 g/dL (ref 30.0–36.0)
MCV: 81.6 fL (ref 80.0–100.0)
Platelets: 196 K/uL (ref 150–400)
RBC: 5.27 MIL/uL — ABNORMAL HIGH (ref 3.87–5.11)
RDW: 13.5 % (ref 11.5–15.5)
WBC: 6.5 K/uL (ref 4.0–10.5)
nRBC: 0 % (ref 0.0–0.2)

## 2023-12-18 LAB — COMPREHENSIVE METABOLIC PANEL WITH GFR
ALT: 29 U/L (ref 0–44)
AST: 26 U/L (ref 15–41)
Albumin: 4.2 g/dL (ref 3.5–5.0)
Alkaline Phosphatase: 89 U/L (ref 38–126)
Anion gap: 13 (ref 5–15)
BUN: 18 mg/dL (ref 8–23)
CO2: 25 mmol/L (ref 22–32)
Calcium: 9.6 mg/dL (ref 8.9–10.3)
Chloride: 99 mmol/L (ref 98–111)
Creatinine, Ser: 0.45 mg/dL (ref 0.44–1.00)
GFR, Estimated: >60 mL/min (ref >60)
Glucose, Bld: 219 mg/dL — ABNORMAL HIGH (ref 70–99)
Potassium: 3.9 mmol/L (ref 3.5–5.1)
Sodium: 137 mmol/L (ref 135–145)
Total Bilirubin: 0.9 mg/dL (ref 0.0–1.2)
Total Protein: 8 g/dL (ref 6.5–8.1)

## 2023-12-18 LAB — URINALYSIS, ROUTINE W REFLEX MICROSCOPIC
Bilirubin Urine: NEGATIVE
Glucose, UA: NEGATIVE mg/dL
Hgb urine dipstick: NEGATIVE
Ketones, ur: 5 mg/dL — AB
Leukocytes,Ua: NEGATIVE
Nitrite: NEGATIVE
Protein, ur: NEGATIVE mg/dL
Specific Gravity, Urine: 1.013 (ref 1.005–1.030)
pH: 5 (ref 5.0–8.0)

## 2023-12-18 LAB — RESP PANEL BY RT-PCR (RSV, FLU A&B, COVID)  RVPGX2
Influenza A by PCR: NEGATIVE
Influenza B by PCR: NEGATIVE
Resp Syncytial Virus by PCR: NEGATIVE
SARS Coronavirus 2 by RT PCR: NEGATIVE

## 2023-12-18 LAB — LIPASE, BLOOD: Lipase: 36 U/L (ref 11–51)

## 2023-12-18 MED ORDER — ONDANSETRON 4 MG PO TBDP: 4.0000 mg | ORAL_TABLET | Freq: Three times a day (TID) | ORAL | 0 refills | Status: AC | PRN

## 2023-12-18 MED ORDER — SODIUM CHLORIDE 0.9 % IV BOLUS
1000.0000 mL | Freq: Once | INTRAVENOUS | Status: AC
Start: 2023-12-18 — End: 2023-12-18
  Administered 2023-12-18: 1000 mL via INTRAVENOUS

## 2023-12-18 MED ORDER — ONDANSETRON HCL 4 MG/2ML IJ SOLN
4.0000 mg | Freq: Once | INTRAMUSCULAR | Status: AC
Start: 2023-12-18 — End: 2023-12-18
  Administered 2023-12-18: 4 mg via INTRAVENOUS
  Filled 2023-12-18: qty 2

## 2023-12-18 NOTE — Discharge Instructions (Signed)
 Acudi a urgencias por un da de disuria, nuseas y vmitos. La evaluacin de hoy fue tranquilizadora y no se detectaron signos de infeccin urinaria ni signos de anomala en los [place]. Su disuria puede ser secundaria a cambios hormonales, por lo que es muy importante que consulte con su mdico de cabecera sobre las indicaciones para la terapia de reemplazo hormonal. Llame y pida cita lo antes posible. Asegrese de que est bien hidratado. Si presenta un dolor abdominal que empeora de forma aguda, regrese, ya que no se realizaron pruebas de imagen debido a que su examen abdominal result benigno en ese momento. Fue Administrator, arts y le deseo mucha suerte. --  You were seen in the emergency department for 1 day of dysuria and nausea and vomiting.  Workup today was reassuring and there was no evidence of a urinary tract infection or signs of abnormality in your blood work.  Your dysuria can be secondary to hormonal changes so it is very important to talk to your primary care physician regarding the indications for hormone replacement.  Please call and make an appointment as soon as possible.  Ensure adequate hydration.  Should you develop any acutely worsening abdominal pain please return, as we did not pursue any imaging as your abdominal exam was benign at this time.  It was very nice meeting you and I wish you the best of luck with everything. -- PRECAUCIONES PARA VOLVER Y SEGUIMIENTO: (ESPAOL) PRECAUCIONES DE DEVOLUCIN: Regrese inmediatamente al departamento de emergencias o llame a su mdico si se siente peor, dbil, le falta el aliento, o tiene [place], vmitos, dolor, sangrado o heces oscuras, dificultad para Geographical information systems officer o cualquier problema nuevo. ATENCIN DE SEGUIMIENTO: Llame a su mdico y/o a los mdicos que lo referieron para obtener ms informacin y para Engineer, water cita. Haz esto hoy, maana o despus del fin de semana. Varios medicos solo toman un seguro PPO, si usted tiene un  seguro HMO debe comunicarse con su HMO o su mdico primario para que lo envie a Music therapist de acuerdo a Psychologist, prison and probation services, de no ser asi tendria que pagar en efectivo por la consulta. Asegrese de decirles que fue una referencia del departamento de emergencias para que pueda obtener la cita ms pronto posible. Si su presin arterial fue superior a 120/80, vuelva a verificar su presin arterial dentro de 1 a 2 semanas. SUS RESULTADOS DE LOS ESTUDIOS: Lleve copias o informes de cualquier examen realizado hoy, incluidos anlisis de sangre u comoros, imgenes y Animal nutritionist, a su mdico y a Comptroller mdico de Mining engineer. Debe repetir las estudios anormales. Su mdico o un mdico de referencia pueden informarle cundo se debe repetir [employer]. Adems, asegrese de que su mdico se comunique con este hospital para obtener otros resultados de [place], como laboratorios que an no se han realizado, e informes finales de imgenes, que pueden contener resultados importantes adicionales no documentados en el informe de Manufacturing engineer. CMO LLEGAR A CASA DE MANERA SEGURA: No conduzca, camine o tome el autobs a su hogar si ha recibido Facilities manager sedante, como medicamentos para la ansiedad o ciertos analgsicos o antihistamnicos como Benadryl. Si este es [place], guinea un taxi a casa o haga que un amigo lo lleve a su casa.

## 2023-12-18 NOTE — ED Provider Notes (Signed)
 [facility] Provider Note    Event Date/Time   First MD Initiated Contact with Patient 12/18/23 1241     (approximate)   History   Emesis  Patient is Spanish-speaking only primarily although son at bedside understands English.  History and physical was obtained using the Spanish interpreter on the iPad  HPI  Breanna Booker is a 68 y.o. female who presents with 3 days of nausea vomiting, 24 hours of urinary hesitancy and dysuria.  She does have a past medical history of diabetes but has not taken her medications for several days.  Denies any abdominal pain, chest pain shortness of breath fevers or chills.  She denies any vaginal discharge.  States that her symptoms are consistent with urinary tract infections that she has had previously      Physical Exam   Triage Vital Signs: ED Triage Vitals  Encounter Vitals Group     BP 12/18/23 1154 (!) 196/103     Girls Systolic BP Percentile --      Girls Diastolic BP Percentile --      Boys Systolic BP Percentile --      Boys Diastolic BP Percentile --      Pulse Rate 12/18/23 1154 94     Resp 12/18/23 1154 19     Temp 12/18/23 1154 97.9 F (36.6 C)     Temp src --      SpO2 12/18/23 1154 95 %     Weight 12/18/23 1155 139 lb 15.9 oz (63.5 kg)     Height 12/18/23 1155 5' 2 (1.575 m)     Head Circumference --      Peak Flow --      Pain Score 12/18/23 1155 0     Pain Loc --      Pain Education --      Exclude from Growth Chart --     Most recent vital signs: Vitals:   12/18/23 1154 12/18/23 1430  BP: (!) 196/103 (!) 162/83  Pulse: 94 85  Resp: 19 20  Temp: 97.9 F (36.6 C)   SpO2: 95% 95%    Nursing Triage Note reviewed. Vital signs reviewed and patients oxygen saturation is normoxic  General: Patient is well nourished, well developed, awake and alert, resting comfortably in no acute distress Head: Normocephalic and atraumatic Eyes: Normal inspection, extraocular muscles intact, no  conjunctival pallor Ear, nose, throat: Normal external exam Neck: Normal range of motion Respiratory: Patient is in no respiratory distress, lungs CTAB Cardiovascular: Patient is not tachycardic, RRR without murmur appreciated GI: Abd SNT with no guarding or rebound, no CVA tenderness to palpation Back: Normal inspection of the back with good strength and range of motion throughout all ext Extremities: pulses intact with good cap refills, no LE pitting edema or calf tenderness Neuro: The patient is alert and oriented to person, place, and time, appropriately conversive, with 5/5 bilat UE/LE strength, no gross motor or sensory defects noted. Coordination appears to be adequate. Skin: Warm, dry, and intact Psych: normal mood and affect, no SI or HI  ED Results / Procedures / Treatments   Labs (all labs ordered are listed, but only abnormal results are displayed) Labs Reviewed  COMPREHENSIVE METABOLIC PANEL WITH GFR - Abnormal; Notable for the following components:      Result Value   Glucose, Bld 219 (*)    All other components within normal limits  CBC - Abnormal; Notable for the following components:   RBC 5.27 (*)  All other components within normal limits  URINALYSIS, ROUTINE W REFLEX MICROSCOPIC - Abnormal; Notable for the following components:   Color, Urine YELLOW (*)    APPearance HAZY (*)    Ketones, ur 5 (*)    All other components within normal limits  RESP PANEL BY RT-PCR (RSV, FLU A&B, COVID)  RVPGX2  LIPASE, BLOOD     EKG EKG and rhythm strip are interpreted by myself:   EKG: [Normal sinus rhythm] at heart rate of 84, normal QRS duration, QTc 451, normal  ST segments and T waves no ectopy EKG not consistent with Acute STEMI Rhythm strip: NSR in in lead II   RADIOLOGY None    PROCEDURES:  Critical Care performed: No  Procedures   MEDICATIONS ORDERED IN ED: Medications  sodium chloride  0.9 % bolus 1,000 mL (0 mLs Intravenous Stopped 12/18/23 1445)   ondansetron  (ZOFRAN ) injection 4 mg (4 mg Intravenous Given 12/18/23 1319)     IMPRESSION / MDM / ASSESSMENT AND PLAN / ED COURSE                                Differential diagnosis includes, but is not limited to, UTI, pyelonephritis, gastroenteritis, URI, acute renal insufficiency, DKA, atypical ACS  ED course: Patient is well-appearing and EKG demonstrates no acute abnormality.  Urinalysis was not consistent with UTI.  She had no leukocytosis or acute anemia.  Patient did have a mildly elevated glucose but her anion gap was not elevated and I do not think this is consistent with DKA.  She was given fluids and Zofran  and felt much improved.  She was able to tolerate p.o.  Repeat abdominal exam again demonstrated no tenderness to palpation.  Patient feels comfortable returning home and following up with her primary care physician.  All questions answered and patient and son voiced understanding and requested discharge   Clinical Course as of 12/18/23 1924  [place] Dec 18, 2023  1338 CBC(!) No profound leukocytosis [HD]  1338 Comprehensive metabolic panel(!) Glucose is 219 but no anion gap and she is getting fluids.  She does not have an elevated creatinine [HD]  1339 Urinalysis, Routine w reflex microscopic -Urine, Clean Catch(!) Urine not consistent with UTI [HD]  1408 Patient and family member counseled on the results.  Repeat abdominal exam is completely benign.  The COVID test is pending however patient will check the results in the patient portal [HD]    Clinical Course User Index [HD] Nicholaus Rolland BRAVO, MD   At time of discharge there is no evidence of acute life, limb, vision, or fertility threat. Patient has stable vital signs, pain is well controlled, patient is ambulatory and p.o. tolerant.  Discharge instructions were completed using the [employer] system. I would refer you to those at this time. All warnings prescriptions follow-up etc. were discussed in detail with the patient.  Patient indicates understanding and is agreeable with this plan. All questions answered.  Patient is made aware that they may return to the emergency department for any worsening or new condition or for any other emergency.  Risk: 5 This patient has a high risk of morbidity due to further diagnostic testing or treatment. Rationale: This patient's evaluation and management involve a high risk of morbidity due to the potential severity of presenting symptoms, need for diagnostic testing, and/or initiation of treatment that may require close monitoring. The differential includes conditions with potential for significant deterioration or  requiring escalation of care. Treatment decisions in the ED, including medication administration, procedural interventions, or disposition planning, reflect this level of risk. Additional Support: Evaluation for DKA -- Drug therapy requiring intensive monitoring for toxicity [ ]  -- Decision regarding elective major surgery with idenitified patient or procedure risk factors [ ]  -- Decision regarding hospitalization or escalation of hospital-level care [ ]  -- Decision not to resuscitate or to de-escalate care because of poor prognosis [ ]  -- Parental controlled substances [ ]   COPA: 5 The patient has a severe exacerbation, progression, or side effect of treatment of the following illness/illnesses: []  OR  The patient has the following acute or chronic illness/injury that poses a possible threat to life or bodily function: [X] : The patient has a potentially serious acute condition or an acute exacerbation of a chronic illness requiring urgent evaluation and management in the Emergency Department. The clinical presentation necessitates immediate consideration of life-threatening or function-threatening diagnoses, even if they are ultimately ruled out.  Data(2/3 categories following were performed): 4 I reviewed or ordered at least three unique tests, external notes,  and/or the history required an independent historian as one of the three requirements as following: CBC, CMP, urinalysis, son AND  I independently interpreted the following test: []  OR  I discussed the management of the patient with the following external physician or qualified healthcare provider: []     Suggested E/M Coding Level: 5, 99285, This has been selected based on the 01-15-22 CPT guidelines for E/M codes in the Emergency Department based on 2/3 of the CoPA, Data, and Risk.   FINAL CLINICAL IMPRESSION(S) / ED DIAGNOSES   Final diagnoses:  Dysuria  Nausea     Rx / DC Orders   ED Discharge Orders          Ordered    ondansetron  (ZOFRAN -ODT) 4 MG disintegrating tablet  Every 8 hours PRN        12/18/23 1416             Note:  This document was prepared using Dragon voice recognition software and may include unintentional dictation errors.   Nicholaus Rolland BRAVO, MD 12/18/23 [phone]

## 2023-12-18 NOTE — ED Triage Notes (Signed)
 Pt comes in via pov with complaints of vomiting x3 days. Pt has no complaints of abdominal pain, but states that she is also having issues with urinating. Pt states that she was able to eat yesterday, but is unable today due to emesis. Pt's last episode of vomiting was this morning around 9 am.  Pt has a history of diabetes.

## 2024-01-29 ENCOUNTER — Encounter: Payer: Self-pay | Admitting: Emergency Medicine

## 2024-01-29 ENCOUNTER — Other Ambulatory Visit: Payer: Self-pay

## 2024-01-29 ENCOUNTER — Ambulatory Visit
Admission: EM | Admit: 2024-01-29 | Discharge: 2024-01-29 | Disposition: A | Discharge status: Home / Self Care | Payer: Self-pay | Attending: Emergency Medicine | Admitting: Emergency Medicine

## 2024-01-29 ENCOUNTER — Emergency Department
Admission: EM | Admit: 2024-01-29 | Discharge: 2024-01-29 | Disposition: A | Discharge status: Home / Self Care | Payer: Self-pay | Source: Ambulatory Visit | Attending: Emergency Medicine | Admitting: Emergency Medicine

## 2024-01-29 ENCOUNTER — Emergency Department: Payer: Self-pay

## 2024-01-29 DIAGNOSIS — R519 Headache, unspecified: Secondary | ICD-10-CM

## 2024-01-29 DIAGNOSIS — I160 Hypertensive urgency: Secondary | ICD-10-CM

## 2024-01-29 DIAGNOSIS — H5711 Ocular pain, right eye: Secondary | ICD-10-CM

## 2024-01-29 DIAGNOSIS — H5712 Ocular pain, left eye: Secondary | ICD-10-CM

## 2024-01-29 DIAGNOSIS — H409 Unspecified glaucoma: Secondary | ICD-10-CM | POA: Insufficient documentation

## 2024-01-29 DIAGNOSIS — I10 Essential (primary) hypertension: Secondary | ICD-10-CM | POA: Insufficient documentation

## 2024-01-29 DIAGNOSIS — E1169 Type 2 diabetes mellitus with other specified complication: Secondary | ICD-10-CM | POA: Insufficient documentation

## 2024-01-29 HISTORY — DX: Essential (primary) hypertension: I10

## 2024-01-29 HISTORY — DX: Type 2 diabetes mellitus without complications: E11.9

## 2024-01-29 LAB — SEDIMENTATION RATE: Sed Rate: 20 mm/h (ref 0–30)

## 2024-01-29 LAB — BASIC METABOLIC PANEL WITH GFR
Anion gap: 13 (ref 5–15)
BUN: 17 mg/dL (ref 8–23)
CO2: 21 mmol/L — ABNORMAL LOW (ref 22–32)
Calcium: 9.1 mg/dL (ref 8.9–10.3)
Chloride: 97 mmol/L — ABNORMAL LOW (ref 98–111)
Creatinine, Ser: 0.59 mg/dL (ref 0.44–1.00)
GFR, Estimated: >60 mL/min
Glucose, Bld: 196 mg/dL — ABNORMAL HIGH (ref 70–99)
Potassium: 3.8 mmol/L (ref 3.5–5.1)
Sodium: 131 mmol/L — ABNORMAL LOW (ref 135–145)

## 2024-01-29 LAB — CBC
HCT: 43 % (ref 36.0–46.0)
Hemoglobin: 14.5 g/dL (ref 12.0–15.0)
MCH: 27.7 pg (ref 26.0–34.0)
MCHC: 33.7 g/dL (ref 30.0–36.0)
MCV: 82.1 fL (ref 80.0–100.0)
Platelets: 192 K/uL (ref 150–400)
RBC: 5.24 MIL/uL — ABNORMAL HIGH (ref 3.87–5.11)
RDW: 13.5 % (ref 11.5–15.5)
WBC: 12.8 K/uL — ABNORMAL HIGH (ref 4.0–10.5)
nRBC: 0 % (ref 0.0–0.2)

## 2024-01-29 MED ORDER — ACETAZOLAMIDE ER 500 MG PO CP12: 500.0000 mg | ORAL_CAPSULE | Freq: Two times a day (BID) | ORAL | 0 refills | Status: AC

## 2024-01-29 MED ORDER — MANNITOL 25 % IV SOLN
12.5000 g | Freq: Once | INTRAVENOUS | Status: DC
  Filled 2024-01-29: qty 50

## 2024-01-29 MED ORDER — METOCLOPRAMIDE HCL 5 MG/ML IJ SOLN
10.0000 mg | Freq: Once | INTRAMUSCULAR | Status: AC
  Administered 2024-01-29: 10 mg via INTRAVENOUS
  Filled 2024-01-29: qty 2

## 2024-01-29 MED ORDER — DIPHENHYDRAMINE HCL 50 MG/ML IJ SOLN
25.0000 mg | Freq: Once | INTRAMUSCULAR | Status: AC
  Administered 2024-01-29: 25 mg via INTRAVENOUS
  Filled 2024-01-29: qty 1

## 2024-01-29 MED ORDER — SODIUM CHLORIDE 0.9 % IV BOLUS
500.0000 mL | Freq: Once | INTRAVENOUS | Status: AC
  Administered 2024-01-29: 500 mL via INTRAVENOUS

## 2024-01-29 MED ORDER — ACETAZOLAMIDE 250 MG PO TABS
500.0000 mg | ORAL_TABLET | Freq: Once | ORAL | Status: AC
  Administered 2024-01-29: 500 mg via ORAL
  Filled 2024-01-29: qty 2

## 2024-01-29 MED ORDER — MANNITOL 20 % IV SOLN
12.5000 g | Freq: Once | INTRAVENOUS | Status: AC
  Administered 2024-01-29: 12.5 g via INTRAVENOUS
  Filled 2024-01-29: qty 250

## 2024-01-29 MED ORDER — TETRACAINE HCL 0.5 % OP SOLN
1.0000 [drp] | Freq: Once | OPHTHALMIC | Status: AC
  Administered 2024-01-29: 1 [drp] via OPHTHALMIC
  Filled 2024-01-29: qty 4

## 2024-01-29 NOTE — ED Provider Notes (Signed)
 [facility] Provider Note    Event Date/Time   First MD Initiated Contact with Patient 01/29/24 1042     (approximate)   History   Headache   HPI  HPI obtained via in person Spanish interpreter  Breanna Booker is a 68 y.o. female with a history of hypertension, CVA, CVRO with blindness, diabetes, hypertension, and glaucoma who presents with left-sided headache for the last 4 days, gradual onset, severe intensity, associated with photophobia as well as with nausea and vomiting today.  Headache is behind the left eye but also over the entire left side of her head.  The patient denies any trauma to the head.  She is blind in the left eye.  She has no change in vision in her right eye and no right eye pain.  She denies any numbness or weakness.    I reviewed the past medical records.  The patient was seen in the [facility] ED on 6/24 with vision loss in her left eye.  She was diagnosed with glaucoma and CRVO.   Physical Exam   Triage Vital Signs: ED Triage Vitals  Encounter Vitals Group     BP 01/29/24 0956 (!) 157/88     Girls Systolic BP Percentile --      Girls Diastolic BP Percentile --      Boys Systolic BP Percentile --      Boys Diastolic BP Percentile --      Pulse Rate 01/29/24 0956 90     Resp 01/29/24 0956 16     Temp 01/29/24 0956 98.5 F (36.9 C)     Temp src --      SpO2 01/29/24 0956 99 %     Weight 01/29/24 0957 139 lb 15.9 oz (63.5 kg)     Height 01/29/24 0957 5' 2 (1.575 m)     Head Circumference --      Peak Flow --      Pain Score 01/29/24 0957 9     Pain Loc --      Pain Education --      Exclude from Growth Chart --     Most recent vital signs: Vitals:   01/29/24 1330 01/29/24 1430  BP: (!) 155/83 (!) 163/89  Pulse: 93 97  Resp: 18 18  Temp:    SpO2: 94% 98%     General: Alert, no distress.  CV:  Good peripheral perfusion.  Resp:  Normal effort.  Abd:  No distention.  Other:  EOMI.  Right pupil round and  reactive to light.  Mild photophobia.  No facial droop.  Normal speech.  Motor intact in all extremities.  No ataxia on finger-to-nose.  No pronator drift.  No significant temporal tenderness.   ED Results / Procedures / Treatments   Labs (all labs ordered are listed, but only abnormal results are displayed) Labs Reviewed  CBC - Abnormal; Notable for the following components:      Result Value   WBC 12.8 (*)    RBC 5.24 (*)    All other components within normal limits  BASIC METABOLIC PANEL WITH GFR - Abnormal; Notable for the following components:   Sodium 131 (*)    Chloride 97 (*)    CO2 21 (*)    Glucose, Bld 196 (*)    All other components within normal limits  SEDIMENTATION RATE     EKG     RADIOLOGY  CT head: I independently viewed and interpreted the images;  there is no ICH.  Radiology report indicates no acute abnormality.  PROCEDURES:  Critical Care performed: No  Procedures   MEDICATIONS ORDERED IN ED: Medications  tetracaine  (PONTOCAINE) 0.5 % ophthalmic solution 1 drop (1 drop Left Eye Given 01/29/24 1108)  metoCLOPramide  (REGLAN ) injection 10 mg (10 mg Intravenous Given 01/29/24 1140)  diphenhydrAMINE  (BENADRYL ) injection 25 mg (25 mg Intravenous Given 01/29/24 1138)  sodium chloride  0.9 % bolus 500 mL (0 mLs Intravenous Stopped 01/29/24 1346)  acetaZOLAMIDE  (DIAMOX ) tablet 500 mg (500 mg Oral Given 01/29/24 1159)  mannitol  20 % infusion 12.5 g (0 g Intravenous Stopped 01/29/24 1425)     IMPRESSION / MDM / ASSESSMENT AND PLAN / ED COURSE  I reviewed the triage vital signs and the nursing notes.  68 year old female with PMH as noted above presents with 4 days of left-sided headache which is somewhat retro-ocular but involving most of the left side of her head and associated with nausea and photophobia.  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  Neurologic exam is nonfocal.  Differential diagnosis includes, but is not  limited to, acute glaucoma, migraine, tension headache, less likely temporal arteritis.  We will obtain a CT head given the patient's age and change in headache pattern, basic labs and ESR,  and give a migraine cocktail.  I checked the ocular pressures; the pressure was 22 on the right and 67 on the left.  I consulted and discussed the case with Dr. Enola from ophthalmology who advised that this type of eye pain and glaucoma with a blind eye can be quite refractory especially if the patient is already on drops.  He recommended oral acetazolamide  and to consider mannitol  if the patient's CT did not show any acute findings.  He agreed with the headache workup and migraine treatment.  He advised that sometimes patients will need surgery although the patient could follow-up in the clinic tomorrow if her headache is improved.  Patient's presentation is most consistent with acute complicated illness / injury requiring diagnostic workup.  The patient is on the cardiac monitor to evaluate for evidence of arrhythmia and/or significant heart rate changes.  ----------------------------------------- 3:11 PM on 01/29/2024 -----------------------------------------  CT is negative.  BMP and CBC are unremarkable.  She did not have much response to the migraine cocktail, but the patient's headache is significantly better after the acetazolamide .  She has also received mannitol .  She feels comfortable enough to go home.  Based on her clinical status and the recommendations from Dr. Enola, she is stable for discharge at this time.  I confirmed with Dr. Enola that I was sending the patient home and that she would be able to follow-up in the clinic tomorrow.  The patient is also aware of these instructions.  I counseled her on the results of the workup, the ophthalmology recommendations, the follow-up plan, and return precautions via the in person Spanish interpreter.  She expressed understanding and agreement.  I  prescribed acetazolamide  as recommended by Dr. Enola.   FINAL CLINICAL IMPRESSION(S) / ED DIAGNOSES   Final diagnoses:  Glaucoma of left eye, unspecified glaucoma type  Acute nonintractable headache, unspecified headache type     Rx / DC Orders   ED Discharge Orders          Ordered    acetaZOLAMIDE  ER (DIAMOX ) 500 MG capsule  2 times daily        01/29/24 1431  Note:  This document was prepared using Dragon voice recognition software and may include unintentional dictation errors.    Jacolyn Pae, MD 01/29/24 1515

## 2024-01-29 NOTE — ED Triage Notes (Signed)
 Pt c/o pain in bilateral eyes that radiates towards the back of her head. Started about 4 days ago. She is vomiting due to the pain. Pt denies dizziness. Pt has drainage, itching from the eyes and sensitivity to light as she is wearing sunglasses.

## 2024-01-29 NOTE — ED Triage Notes (Signed)
 Pt to ED from [facility] for stabbing pain in left eye x4 days, not letting me sleep. +HTN, has been taking meds. Hx glaucoma.  Verbal orders Dr Jacolyn

## 2024-01-29 NOTE — ED Notes (Signed)
 Patient is being discharged from the Urgent Care and sent to the Emergency Department via POV . Per Dr. Van, patient is in need of higher level of care due to eye pain, vision loss, and HTN. Patient is aware and verbalizes understanding of plan of care.  Vitals:   01/29/24 0849 01/29/24 0851  BP: (!) 181/114 (!) 187/111  Pulse: 88   Resp: 16   Temp: 98.7 F (37.1 C)   SpO2: 97%

## 2024-01-29 NOTE — Discharge Instructions (Addendum)
 Llame al consultorio del oftalmlogo maana a las 8:00 a. m. e infrmeles que lo atendieron en urgencias y que el Dr. Enola deseaba que lo atendieran en la clnica el lunes. Regrese a urgencias si presenta dolor de cabeza, dolor ocular o cualquier otro sntoma nuevo, persistente o que haya empeorado.

## 2024-01-29 NOTE — ED Provider Notes (Signed)
 HPI  SUBJECTIVE:  Breanna Booker is a 68 y.o. female who presents with the acute onset of severe left-sided constant headache described as worst headache ever accompanied with stabbing constant left eye pain, nausea, vomiting, photophobia, dizziness, increased tearing.  She reports intermittent left facial numbness and swelling for the past 4 days.  She reports blurry vision in the right eye.  No fevers, discharge, double vision or visual loss in the right eye.  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  No eye pain worse in the dark no other arm/leg numbness/tingling/weakness, facial droop, slurred speech, syncope, seizures.  No chest pain, shortness of breath, palpitations.  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  She has been taking her glaucoma eyedrops without improvement in her symptoms.  Symptoms are worse with exposure to light.  She has not tried anything else for her symptoms.  Patient was seen in the emergency department on 6/24 for eye pain/visual loss.  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  She has a past medical history of diabetic retinopathy,, left-sided glaucoma, left-sided CVRO with resulting blindness, diabetes, hypertension.  States that her blood pressure has been measuring at 240/225 at home, but that she is compliant with her blood pressure medications.  No history of CVA.  PCP: Genetta Potters.  Ophthalmology: [facility]  All history obtained through video interpreter Jesus [phone]  Past Medical History:  Diagnosis Date   Diabetes mellitus without complication (HCC)    Hypertension     History reviewed. No pertinent surgical history.  Family History  Problem Relation Age of Onset   Breast cancer Neg Hx     Social History   Tobacco  Use   Smoking status: Never   Smokeless tobacco: Never  Vaping Use   Vaping status: Never Used  Substance Use Topics   Alcohol use: Not Currently   Drug use: Not Currently    No current facility-administered medications for this encounter.  Current Outpatient Medications:    lisinopril (ZESTRIL) 10 MG tablet, Take 10 mg by mouth daily., Disp: , Rfl:    ondansetron  (ZOFRAN -ODT) 4 MG disintegrating tablet, Take 1 tablet (4 mg total) by mouth every 8 (eight) hours as needed for nausea or vomiting., Disp: 20 tablet, Rfl: 0  No Known Allergies   ROS  As noted in HPI.   Physical Exam  BP (!) 187/111 (BP Location: Right Arm)   Pulse 88   Temp 98.7 F (37.1 C) (Oral)   Resp 16   Ht 5' 2 (1.575 m)   Wt 63.5 kg   SpO2 97%   BMI 25.60 kg/m  BP Readings from Last 3 Encounters:  01/29/24 (!) 187/111  12/18/23 (!) 162/83  06/25/20 (!) 155/93    Constitutional: Well developed, well nourished, photophobic.  Wearing glasses.  Appears uncomfortable. Eyes:  EOMI, left sided conjunctival injection, corneas steamy, sluggish. Corrected visual acuity Right 20/70 Left: Completely blind.  Unable to see fingers  HENT: Normocephalic, atraumatic,mucus membranes moist Respiratory: Normal inspiratory effort, lungs clear bilaterally Cardiovascular: Normal rate, regular rhythm GI: nondistended skin: No rash, skin intact Musculoskeletal: no deformities Neurologic: Alert & oriented x 3, cranial nerves III through XII intact, finger-nose, heel-to-shin within normal limits.  Tandem gait steady.  Speech  fluent.  No facial droop. Psychiatric: Speech and behavior appropriate   ED Course   Medications - No data to display  Orders Placed This Encounter  Procedures   Visual acuity screening    Standing Status:   Standing    Number of Occurrences:   1    No results found for this or any previous visit (from the past 24 hours). No results found.  ED Clinical Impression  1. Left eye pain    2. Severe headache   3. Hypertensive urgency      ED Assessment/Plan     Outside ER and specialty records reviewed.  Additional medical history obtained.  As noted in HPI.  1.  Left eye pain, headache.  Concern for recurrent glaucoma.  We do not have the ability to measure IOP's here or urgent manage acute glaucoma.  She is neurologically intact here.  I offered to call EMS for her, but she has opted to go via private vehicle.  I feel that she is stable to go via private vehicle.  She has a friend to drive her.  Transferring to the [facility] emergency department for further evaluation, management, and possible specialty consultation if deemed necessary.  2.  Elevated blood-pressure reading with diagnosis of hypertension.  No historical evidence of ACS, no physical exam findings of stroke.  However given that this is the worst headache that she has ever had, I am concerned for hypertensive urgency.  Using the video interpreter, discussed rationale for transfer to the emergency department with patient and family member.  Emphasizes importance of going there immediately.  They have agreed to go.  Gave report to Rosina, Press photographer at [facility].  No orders of the defined types were placed in this encounter.     *This clinic note was created using Dragon dictation software. Therefore, there may be occasional mistakes despite careful proofreading.  ?    Van Rosina, MD 01/29/24 [phone]

## 2024-01-29 NOTE — Discharge Instructions (Signed)
 Go immediately to the [facility] emergency department.  I am concerned that your glaucoma has recurred.  I am also concerned because you have a severe headache with a significantly elevated blood pressure of 187/111.  Let them know if your pain changes, gets worse, you start having face/arm/leg numbness/tingling/weakness, slurred speech, chest pain, shortness of breath, palpitations, or sudden problems with your balance.  I will call them and let them know that you are on your way  Acuda de inmediato a urgencias de [place]. Me preocupa que su glaucoma haya reaparecido. Tambin me preocupa que tenga un fuerte dolor de cabeza con una presin arterial significativamente elevada de 187/111. Avseles si el dolor cambia, empeora, empieza a tener entumecimiento, hormigueo o debilidad en la cara, brazos o piernas, dificultad para hablar, dolor en el pecho, dificultad para respirar, palpitaciones o problemas repentinos de equilibrio.  Los llamar y les har saber que ests en camino.
